# Patient Record
Sex: Male | Born: 1975 | Race: White | Hispanic: Yes | Marital: Married | Smoking: Current every day smoker
Health system: Southern US, Community
[De-identification: ages and names within clinical notes are randomized; demographics above are authoritative.]

## PROBLEM LIST (undated history)

## (undated) DIAGNOSIS — M109 Gout, unspecified: Secondary | ICD-10-CM

---

## 2011-11-10 ENCOUNTER — Encounter (HOSPITAL_COMMUNITY): Payer: Self-pay | Admitting: Emergency Medicine

## 2011-11-10 ENCOUNTER — Emergency Department (HOSPITAL_COMMUNITY)
Admission: EM | Admit: 2011-11-10 | Discharge: 2011-11-10 | Disposition: A | Discharge status: Home / Self Care | Payer: 59 | Attending: Emergency Medicine | Admitting: Emergency Medicine

## 2011-11-10 DIAGNOSIS — J309 Allergic rhinitis, unspecified: Secondary | ICD-10-CM | POA: Insufficient documentation

## 2011-11-10 DIAGNOSIS — J302 Other seasonal allergic rhinitis: Secondary | ICD-10-CM

## 2011-11-10 MED ORDER — LORATADINE 10 MG PO TABS
10.0000 mg | ORAL_TABLET | ORAL | Status: AC
  Administered 2011-11-10: 10 mg via ORAL
  Filled 2011-11-10: qty 1

## 2011-11-10 MED ORDER — LORATADINE 10 MG PO TABS: 10.0000 mg | ORAL_TABLET | Freq: Every day | ORAL | Status: DC

## 2011-11-10 MED ORDER — DEXAMETHASONE SODIUM PHOSPHATE 10 MG/ML IJ SOLN
10.0000 mg | Freq: Once | INTRAMUSCULAR | Status: AC
  Administered 2011-11-10: 10 mg via INTRAMUSCULAR
  Filled 2011-11-10: qty 1

## 2011-11-10 NOTE — ED Notes (Signed)
Patient with scratchy throat since Friday.  Patient states he has been having cough with yellow phelgm.  Patient states that his throat is still itchy and sore.

## 2011-11-10 NOTE — ED Provider Notes (Signed)
History     CSN: [id number]  Arrival date & time 11/10/11  1944   First MD Initiated Contact with Patient 11/10/11 2012      Chief Complaint  Patient presents with  . Sore Throat    (Consider location/radiation/quality/duration/timing/severity/associated sxs/prior treatment) Patient is a 36 y.o. male presenting with pharyngitis. The history is provided by the patient. No language interpreter was used.  Sore Throat This is a new problem. The problem occurs constantly. The problem has been unchanged. Associated symptoms include chills, congestion, coughing and a sore throat. Pertinent negatives include no abdominal pain, anorexia, arthralgias, change in bowel habit, chest pain, diaphoresis, fatigue, fever, headaches, joint swelling, myalgias, nausea, neck pain, numbness, rash, swollen glands, urinary symptoms, vertigo, visual change, vomiting or weakness. The symptoms are aggravated by swallowing. He has tried nothing for the symptoms. The treatment provided no relief.    History reviewed. No pertinent past medical history.  History reviewed. No pertinent past surgical history.  History reviewed. No pertinent family history.  History  Substance Use Topics  . Smoking status: Current Everyday Smoker  . Smokeless tobacco: Not on file  . Alcohol Use: Yes      Review of Systems  Constitutional: Positive for chills. Negative for fever, diaphoresis and fatigue.  HENT: Positive for congestion and sore throat. Negative for neck pain.   Respiratory: Positive for cough.   Cardiovascular: Negative for chest pain.  Gastrointestinal: Negative for nausea, vomiting, abdominal pain, anorexia and change in bowel habit.  Musculoskeletal: Negative for myalgias, joint swelling and arthralgias.  Skin: Negative for rash.  Neurological: Negative for vertigo, weakness, numbness and headaches.  All other systems reviewed and are negative.    Allergies  Review of patient's allergies indicates no  known allergies.  Home Medications  No current outpatient prescriptions on file.  BP 136/90  Pulse 87  Temp(Src) 98.5 F (36.9 C) (Oral)  Resp 16  SpO2 97%  Physical Exam  Nursing note and vitals reviewed. Constitutional: He is oriented to person, place, and time. He appears well-developed and well-nourished. No distress.  HENT:  Head: Normocephalic and atraumatic.  Right Ear: External ear normal.  Left Ear: External ear normal.  Mouth/Throat: No oropharyngeal exudate.       Clear rhinorrhea - mild pharyngeal erythema  Eyes: Conjunctivae are normal. Pupils are equal, round, and reactive to light. No scleral icterus.  Neck: Normal range of motion. Neck supple.  Cardiovascular: Normal rate, regular rhythm and normal heart sounds.  Exam reveals no gallop and no friction rub.   No murmur heard. Pulmonary/Chest: Effort normal and breath sounds normal. No respiratory distress. He has no wheezes. He has no rales. He exhibits no tenderness.  Abdominal: Soft. Bowel sounds are normal. He exhibits no distension. There is no tenderness.  Musculoskeletal: Normal range of motion. He exhibits no edema and no tenderness.  Lymphadenopathy:    He has no cervical adenopathy.  Neurological: He is alert and oriented to person, place, and time. No cranial nerve deficit.  Skin: Skin is warm and dry. No rash noted. No erythema. No pallor.  Psychiatric: He has a normal mood and affect. His behavior is normal. Judgment and thought content normal.    ED Course  Procedures (including critical care time)  Labs Reviewed - No data to display No results found.   Season allergies    MDM  Patient is afebrile with season allergy vs viral pharyngitis type symptoms.  Placed on claritin and given injection of decadron -  will continue the claritin and patient will continue symptomatic relief - understands that this may last for several weeks.        Izola Price [place], [place] 11/10/11 2102

## 2011-11-10 NOTE — Discharge Instructions (Signed)
Allergic Rhinitis Allergic rhinitis is when the mucous membranes in the nose respond to allergens. Allergens are particles in the air that cause your body to have an allergic reaction. This causes you to release allergic antibodies. Through a chain of events, these eventually cause you to release histamine into the blood stream (hence the use of antihistamines). Although meant to be protective to the body, it is this release that causes your discomfort, such as frequent sneezing, congestion and an itchy runny nose.  CAUSES  The pollen allergens may come from grasses, trees, and weeds. This is seasonal allergic rhinitis, or "hay fever." Other allergens cause year-round allergic rhinitis (perennial allergic rhinitis) such as house dust mite allergen, pet dander and mold spores.  SYMPTOMS   Nasal stuffiness (congestion).   Runny, itchy nose with sneezing and tearing of the eyes.   There is often an itching of the mouth, eyes and ears.  It cannot be cured, but it can be controlled with medications. DIAGNOSIS  If you are unable to determine the offending allergen, skin or blood testing may find it. TREATMENT   Avoid the allergen.   Medications and allergy shots (immunotherapy) can help.   Hay fever may often be treated with antihistamines in pill or nasal spray forms. Antihistamines block the effects of histamine. There are over-the-counter medicines that may help with nasal congestion and swelling around the eyes. Check with your caregiver before taking or giving this medicine.  If the treatment above does not work, there are many new medications your caregiver can prescribe. Stronger medications may be used if initial measures are ineffective. Desensitizing injections can be used if medications and avoidance fails. Desensitization is when a patient is given ongoing shots until the body becomes less sensitive to the allergen. Make sure you follow up with your caregiver if problems continue. SEEK  MEDICAL CARE IF:   You develop fever (more than 100.5 F (38.1 C).   You develop a cough that does not stop easily (persistent).   You have shortness of breath.   You start wheezing.   Symptoms interfere with normal daily activities.  Document Released: 03/13/2001 Document Revised: 06/07/2011 Document Reviewed: 09/22/2008 [facility] Patient Information 2012 [place], [place].Allergies, Generic Allergies may happen from anything your body is sensitive to. This may be food, medicines, pollens, chemicals, and nearly anything around you in everyday life that produces allergens. An allergen is anything that causes an allergy producing substance. Heredity is often a factor in causing these problems. This means you may have some of the same allergies as your parents. Food allergies happen in all age groups. Food allergies are some of the most severe and life threatening. Some common food allergies are cow's milk, seafood, eggs, nuts, wheat, and soybeans. SYMPTOMS   Swelling around the mouth.   An itchy red rash or hives.   Vomiting or diarrhea.   Difficulty breathing.  SEVERE ALLERGIC REACTIONS ARE LIFE-THREATENING. This reaction is called anaphylaxis. It can cause the mouth and throat to swell and cause difficulty with breathing and swallowing. In severe reactions only a trace amount of food (for example, peanut oil in a salad) may cause death within seconds. Seasonal allergies occur in all age groups. These are seasonal because they usually occur during the same season every year. They may be a reaction to molds, grass pollens, or tree pollens. Other causes of problems are house dust mite allergens, pet dander, and mold spores. The symptoms often consist of nasal congestion, a runny itchy nose  associated with sneezing, and tearing itchy eyes. There is often an associated itching of the mouth and ears. The problems happen when you come in contact with pollens and other allergens. Allergens are the  particles in the air that the body reacts to with an allergic reaction. This causes you to release allergic antibodies. Through a chain of events, these eventually cause you to release histamine into the blood stream. Although it is meant to be protective to the body, it is this release that causes your discomfort. This is why you were given anti-histamines to feel better. If you are unable to pinpoint the offending allergen, it may be determined by skin or blood testing. Allergies cannot be cured but can be controlled with medicine. Hay fever is a collection of all or some of the seasonal allergy problems. It may often be treated with simple over-the-counter medicine such as diphenhydramine. Take medicine as directed. Do not drink alcohol or drive while taking this medicine. Check with your caregiver or package insert for child dosages. If these medicines are not effective, there are many new medicines your caregiver can prescribe. Stronger medicine such as nasal spray, eye drops, and corticosteroids may be used if the first things you try do not work well. Other treatments such as immunotherapy or desensitizing injections can be used if all else fails. Follow up with your caregiver if problems continue. These seasonal allergies are usually not life threatening. They are generally more of a nuisance that can often be handled using medicine. HOME CARE INSTRUCTIONS   If unsure what causes a reaction, keep a diary of foods eaten and symptoms that follow. Avoid foods that cause reactions.   If hives or rash are present:   Take medicine as directed.   You may use an over-the-counter antihistamine (diphenhydramine) for hives and itching as needed.   Apply cold compresses (cloths) to the skin or take baths in cool water. Avoid hot baths or showers. Heat will make a rash and itching worse.   If you are severely allergic:   Following a treatment for a severe reaction, hospitalization is often required for  closer follow-up.   Wear a medic-alert bracelet or necklace stating the allergy.   You and your family must learn how to give adrenaline or use an anaphylaxis kit.   If you have had a severe reaction, always carry your anaphylaxis kit or EpiPen with you. Use this medicine as directed by your caregiver if a severe reaction is occurring. Failure to do so could have a fatal outcome.  SEEK MEDICAL CARE IF:  You suspect a food allergy. Symptoms generally happen within 30 minutes of eating a food.   Your symptoms have not gone away within 2 days or are getting worse.   You develop new symptoms.   You want to retest yourself or your child with a food or drink you think causes an allergic reaction. Never do this if an anaphylactic reaction to that food or drink has happened before. Only do this under the care of a caregiver.  SEEK IMMEDIATE MEDICAL CARE IF:   You have difficulty breathing, are wheezing, or have a tight feeling in your chest or throat.   You have a swollen mouth, or you have hives, swelling, or itching all over your body.   You have had a severe reaction that has responded to your anaphylaxis kit or an EpiPen. These reactions may return when the medicine has worn off. These reactions should be considered life threatening.  MAKE SURE YOU:   Understand these instructions.   Will watch your condition.   Will get help right away if you are not doing well or get worse.  Document Released: 09/11/2002 Document Revised: 06/07/2011 Document Reviewed: 02/16/2008 [facility] Patient Information 2012 [place], [place].

## 2011-11-10 NOTE — ED Notes (Signed)
Patient with cough and yellow phlegm production.  Patient states he does have a scratchy throat also.  Patient states that it started yesterday.   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

## 2011-11-11 NOTE — ED Provider Notes (Signed)
Medical screening examination/treatment/procedure(s) were performed by non-physician practitioner and as supervising physician I was immediately available for consultation/collaboration.   Carleene Cooper III, MD 11/11/11 1352

## 2011-12-04 ENCOUNTER — Emergency Department (HOSPITAL_COMMUNITY)
Admission: EM | Admit: 2011-12-04 | Discharge: 2011-12-04 | Disposition: A | Discharge status: Home / Self Care | Payer: 59 | Source: Home / Self Care | Attending: Emergency Medicine | Admitting: Emergency Medicine

## 2011-12-04 ENCOUNTER — Encounter (HOSPITAL_COMMUNITY): Payer: Self-pay | Admitting: *Deleted

## 2011-12-04 DIAGNOSIS — R05 Cough: Secondary | ICD-10-CM

## 2011-12-04 DIAGNOSIS — J029 Acute pharyngitis, unspecified: Secondary | ICD-10-CM

## 2011-12-04 LAB — POCT RAPID STREP A: Streptococcus, Group A Screen (Direct): NEGATIVE

## 2011-12-04 MED ORDER — GUAIFENESIN-CODEINE 100-10 MG/5ML PO SYRP: 5.0000 mL | ORAL_SOLUTION | Freq: Three times a day (TID) | ORAL | Status: AC | PRN

## 2011-12-04 MED ORDER — FEXOFENADINE-PSEUDOEPHED ER 60-120 MG PO TB12: 1.0000 | ORAL_TABLET | Freq: Two times a day (BID) | ORAL | Status: AC

## 2011-12-04 NOTE — ED Provider Notes (Signed)
History     CSN: [id number]  Arrival date & time 12/04/11  1705   First MD Initiated Contact with Patient 12/04/11 1708      Chief Complaint  Patient presents with  . Sore Throat    (Consider location/radiation/quality/duration/timing/severity/associated sxs/prior treatment) HPI Comments: Patient presents today to urgent care describing that despite some mild improvement after the steroid shot that he got to the emergency department his symptoms are back as he is having pain and discomfort on his throat and is also coughing as he slammed on his throat. He denies any shortness of breath or wheezing. Does describe some phlegm and cough and occasional sneezing although not frequently. Is taking over-the-counter Claritin but have not tried any decongestants.  Patient is a 36 y.o. male presenting with pharyngitis. The history is provided by the patient.  Sore Throat This is a recurrent problem. The current episode started more than 1 week ago. The problem occurs constantly. The problem has not changed since onset.Pertinent negatives include no abdominal pain and no shortness of breath. The symptoms are aggravated by swallowing. The symptoms are relieved by nothing. Treatments tried: loratadine.    History reviewed. No pertinent past medical history.  History reviewed. No pertinent past surgical history.  No family history on file.  History  Substance Use Topics  . Smoking status: Current Everyday Smoker  . Smokeless tobacco: Not on file  . Alcohol Use: Yes      Review of Systems  Constitutional: Negative for fever, chills, diaphoresis, activity change, appetite change, fatigue and unexpected weight change.  HENT: Positive for sore throat. Negative for congestion, facial swelling, rhinorrhea, trouble swallowing, neck pain, neck stiffness and voice change.   Respiratory: Positive for cough. Negative for shortness of breath and wheezing.   Gastrointestinal: Negative for abdominal  pain.    Allergies  Review of patient's allergies indicates no known allergies.  Home Medications   Current Outpatient Rx  Name Route Sig Dispense Refill  . FEXOFENADINE-PSEUDOEPHED ER 60-120 MG PO TB12 Oral Take 1 tablet by mouth every 12 (twelve) hours. 30 tablet 0  . GUAIFENESIN-CODEINE 100-10 MG/5ML PO SYRP Oral Take 5 mLs by mouth 3 (three) times daily as needed for cough. 120 mL 0    BP 130/84  Pulse 86  Temp(Src) 98.5 F (36.9 C) (Oral)  Resp 16  SpO2 99%  Physical Exam  Nursing note and vitals reviewed. Constitutional: He appears well-developed and well-nourished. No distress.  HENT:  Head: Normocephalic.  Mouth/Throat: Uvula is midline and mucous membranes are normal. Posterior oropharyngeal erythema present. No oropharyngeal exudate, posterior oropharyngeal edema or tonsillar abscesses.  Eyes: Conjunctivae are normal.  Cardiovascular: Normal rate.   Pulmonary/Chest: Effort normal and breath sounds normal. No respiratory distress. He has no wheezes. He has no rales. He exhibits no tenderness.  Abdominal: He exhibits no distension.  Skin: No rash noted. No erythema.    ED Course  Procedures (including critical care time)   Labs Reviewed  POCT RAPID STREP A ([facility]RG CARE ONLY)   No results found.   1. Pharyngitis   2. Cough       MDM  Recurrent pharyngitis. Patient went to the emergency department recently and got a steroid shot. As he was diagnosis allergenic symptoms. He said he improved after shot the symptoms have returned again. Strep test was negative patient has no stigmata of an infectious process but, no fevers no malaise or generalized symptoms. Encourage patient to take a cycle of Allegra-D for  2 weeks and have prescribed a suspicion for his cough and postnasal drip. Encourage him to followup with ENT Dr. if no improvement is noted.        Jimmie Molly, MD 12/04/11 [phone]

## 2011-12-04 NOTE — Discharge Instructions (Signed)
     As discussed if no improvement after 2 weeks using both Allegra-D in the cough suppressant should followup with the ENT Dr. Your symptoms and exam were not consistent with an infectious process. You had a negative strep test today as well.   Cough, Adult  A cough is a reflex that helps clear your throat and airways. It can help heal the body or may be a reaction to an irritated airway. A cough may only last 2 or 3 weeks (acute) or may last more than 8 weeks (chronic).  CAUSES Acute cough:  Viral or bacterial infections.  Chronic cough:  Infections.   Allergies.   Asthma.   Post-nasal drip.   Smoking.   Heartburn or acid reflux.   Some medicines.   Chronic lung problems (COPD).   Cancer.  SYMPTOMS   Cough.   Fever.   Chest pain.   Increased breathing rate.   High-pitched whistling sound when breathing (wheezing).   Colored mucus that you cough up (sputum).  TREATMENT   A bacterial cough may be treated with antibiotic medicine.   A viral cough must run its course and will not respond to antibiotics.   Your caregiver may recommend other treatments if you have a chronic cough.  HOME CARE INSTRUCTIONS   Only take over-the-counter or prescription medicines for pain, discomfort, or fever as directed by your caregiver. Use cough suppressants only as directed by your caregiver.   Use a cold steam vaporizer or humidifier in your bedroom or home to help loosen secretions.   Sleep in a semi-upright position if your cough is worse at night.   Rest as needed.   Stop smoking if you smoke.  SEEK IMMEDIATE MEDICAL CARE IF:   You have pus in your sputum.   Your cough starts to worsen.   You cannot control your cough with suppressants and are losing sleep.   You begin coughing up blood.   You have difficulty breathing.   You develop pain which is getting worse or is uncontrolled with medicine.   You have a fever.  MAKE SURE YOU:   Understand these  instructions.   Will watch your condition.   Will get help right away if you are not doing well or get worse.  Document Released: 12/15/2010 Document Revised: 06/07/2011 Document Reviewed: 12/15/2010 [facility] Patient Information 2012 [place], [place].

## 2011-12-04 NOTE — ED Notes (Signed)
Pt    Reports  Symptoms  Of  sorethroat        Cough          Swollen  Sensation in throat          Seen er  Last  Week  Given steriod  Shot   He         Reports  He  Was  Told  He  Had  allergys  And  Has  Been taking       otc    meds         He  Reports  Symptoms  Worse  Since last week

## 2012-02-15 ENCOUNTER — Emergency Department (HOSPITAL_COMMUNITY)
Admission: EM | Admit: 2012-02-15 | Discharge: 2012-02-15 | Disposition: A | Discharge status: Home / Self Care | Payer: 59 | Source: Home / Self Care

## 2012-02-15 ENCOUNTER — Encounter (HOSPITAL_COMMUNITY): Payer: Self-pay | Admitting: *Deleted

## 2012-02-15 DIAGNOSIS — M79673 Pain in unspecified foot: Secondary | ICD-10-CM

## 2012-02-15 DIAGNOSIS — M109 Gout, unspecified: Secondary | ICD-10-CM

## 2012-02-15 DIAGNOSIS — M79609 Pain in unspecified limb: Secondary | ICD-10-CM

## 2012-02-15 HISTORY — DX: Gout, unspecified: M10.9

## 2012-02-15 LAB — POCT I-STAT, CHEM 8
BUN: 17 mg/dL (ref 6–23)
Chloride: 102 mEq/L (ref 96–112)
HCT: 55 % — ABNORMAL HIGH (ref 39.0–52.0)
Potassium: 4.2 mEq/L (ref 3.5–5.1)
Sodium: 139 mEq/L (ref 135–145)

## 2012-02-15 MED ORDER — INDOMETHACIN 25 MG PO CAPS: 25.0000 mg | ORAL_CAPSULE | Freq: Three times a day (TID) | ORAL | Status: AC | PRN

## 2012-02-15 NOTE — ED Provider Notes (Signed)
Medical screening examination/treatment/procedure(s) were performed by non-physician practitioner and as supervising physician I was immediately available for consultation/collaboration.  Asher Babilonia   Luciano Cinquemani, MD 02/15/12 1714 

## 2012-02-15 NOTE — ED Notes (Signed)
Pt  Has  Gout    He  Took  Indocin in  Past   - he  Is  Out of  meds  He   Has  Pain r  Foot  Big toe  Area   Today  He  Voices  No  Other  Complaints

## 2012-02-15 NOTE — ED Provider Notes (Signed)
History     CSN: [id number]  Arrival date & time 02/15/12  1316   None     Chief Complaint  Patient presents with  . Gout    (Consider location/radiation/quality/duration/timing/severity/associated sxs/prior treatment) The history is provided by the patient.   James Scott is a 36 y.o. male who complains of right great toe pain since this morning.  Mechanism of injury: none. Immediate symptoms: pain and tenderness. Symptoms have been increasingly worse since that time, worse with movement of toe and ambulating.  Prior history of related problems: known history of gout for which he has taken indomethacin, noticed medication expired this am. Past Medical History  Diagnosis Date  . Gout     History reviewed. No pertinent past surgical history.  No family history on file.  History  Substance Use Topics  . Smoking status: Current Everyday Smoker  . Smokeless tobacco: Not on file  . Alcohol Use: Yes      Review of Systems  Constitutional: Negative.   Respiratory: Negative.   Cardiovascular: Negative.   Musculoskeletal: Positive for joint swelling. Negative for myalgias, back pain, arthralgias and gait problem.  Skin: Negative.     Allergies  Review of patient's allergies indicates no known allergies.  Home Medications   Current Outpatient Rx  Name Route Sig Dispense Refill  . FEXOFENADINE-PSEUDOEPHED ER 60-120 MG PO TB12 Oral Take 1 tablet by mouth every 12 (twelve) hours. 30 tablet 0  . INDOMETHACIN 25 MG PO CAPS Oral Take 1 capsule (25 mg total) by mouth 3 (three) times daily as needed. 30 capsule 2    BP 136/99  Pulse 80  Temp 98.1 F (36.7 C) (Oral)  Resp 18  SpO2 100%  Physical Exam  Nursing note and vitals reviewed. Constitutional: He is oriented to person, place, and time. Vital signs are normal. He appears well-developed and well-nourished. He is active and cooperative.  HENT:  Head: Normocephalic.  Eyes: Conjunctivae are normal. Pupils are equal,  round, and reactive to light. No scleral icterus.  Neck: Trachea normal. Neck supple.  Cardiovascular: Normal rate and regular rhythm.   Pulmonary/Chest: Effort normal and breath sounds normal.  Musculoskeletal:       Right knee: Normal.       Right ankle: Normal. Achilles tendon normal.       Left ankle: Normal. Achilles tendon normal.       Right foot: He exhibits tenderness. He exhibits normal range of motion, no bony tenderness, no swelling, normal capillary refill, no crepitus, no deformity and no laceration.       Left foot: Normal.       Feet:       Right great toe tenderness, swelling, no drainage, no cellulitis, skin intact  Neurological: He is alert and oriented to person, place, and time. No cranial nerve deficit or sensory deficit.  Skin: Skin is warm and dry.  Psychiatric: He has a normal mood and affect. His speech is normal and behavior is normal. Judgment and thought content normal. Cognition and memory are normal.    ED Course  Procedures (including critical care time)  Labs Reviewed  POCT I-STAT, CHEM 8 - Abnormal; Notable for the following:    Glucose, Bld 105 (*)     Hemoglobin 18.7 (*)     HCT 55.0 (*)     All other components within normal limits   No results found.   1. Gout attack   2. Foot pain  MDM  Call health connect so that you may establish a primary care provider.  Take indomethacin as prescribed.  Return if symptoms are not improved.          Johnsie Kindred, NP 02/15/12 1423

## 2014-08-24 ENCOUNTER — Emergency Department (INDEPENDENT_AMBULATORY_CARE_PROVIDER_SITE_OTHER): Payer: BLUE CROSS/BLUE SHIELD

## 2014-08-24 ENCOUNTER — Encounter (HOSPITAL_COMMUNITY): Payer: Self-pay | Admitting: Emergency Medicine

## 2014-08-24 ENCOUNTER — Emergency Department (HOSPITAL_COMMUNITY)
Admission: EM | Admit: 2014-08-24 | Discharge: 2014-08-24 | Disposition: A | Discharge status: Home / Self Care | Payer: BLUE CROSS/BLUE SHIELD | Source: Home / Self Care | Attending: Family Medicine | Admitting: Family Medicine

## 2014-08-24 DIAGNOSIS — J209 Acute bronchitis, unspecified: Secondary | ICD-10-CM

## 2014-08-24 MED ORDER — HYDROCOD POLST-CHLORPHEN POLST 10-8 MG/5ML PO LQCR: 5.0000 mL | Freq: Two times a day (BID) | ORAL | Status: DC | PRN

## 2014-08-24 MED ORDER — BENZONATATE 200 MG PO CAPS: 200.0000 mg | ORAL_CAPSULE | Freq: Three times a day (TID) | ORAL | Status: DC | PRN

## 2014-08-24 MED ORDER — PREDNISONE 10 MG PO TABS: ORAL_TABLET | ORAL | Status: DC

## 2014-08-24 MED ORDER — ALBUTEROL SULFATE HFA 108 (90 BASE) MCG/ACT IN AERS: 2.0000 | INHALATION_SPRAY | RESPIRATORY_TRACT | Status: DC | PRN

## 2014-08-24 NOTE — ED Notes (Signed)
C/o  Persistent cough for 5 days.  States worse at night and is productive.  No relief with robitussin and mucinex.   Fever off/on with chills.

## 2014-08-24 NOTE — Discharge Instructions (Signed)

## 2014-08-24 NOTE — ED Provider Notes (Signed)
CSN: [id number]     Arrival date & time 08/24/14  4098 History   None    Chief Complaint  Patient presents with  . Cough   (Consider location/radiation/quality/duration/timing/severity/associated sxs/prior Treatment) HPI      39 year old male presents for cough. He has had a cough for 5 days. It is constant, worse at night. His wife recently had a similar illness. He also has chills, no measured fever. He has mild shortness of breath on exertion. No chest pain, NVD. No recent travel  Past Medical History  Diagnosis Date  . Gout    History reviewed. No pertinent past surgical history. History reviewed. No pertinent family history. History  Substance Use Topics  . Smoking status: Current Every Day Smoker  . Smokeless tobacco: Not on file  . Alcohol Use: Yes    Review of Systems  Constitutional: Positive for chills. Negative for fever.  HENT: Positive for congestion and sore throat. Negative for ear pain and rhinorrhea.   Respiratory: Positive for cough, chest tightness and shortness of breath. Negative for wheezing.   Cardiovascular: Negative for chest pain.  Gastrointestinal: Negative for nausea, vomiting, abdominal pain and diarrhea.  All other systems reviewed and are negative.   Allergies  Review of patient's allergies indicates no known allergies.  Home Medications   Prior to Admission medications   Medication Sig Start Date End Date Taking? Authorizing Provider  albuterol (PROVENTIL HFA;VENTOLIN HFA) 108 (90 BASE) MCG/ACT inhaler Inhale 2 puffs into the lungs every 4 (four) hours as needed for wheezing. 08/24/14   Graylon Good, PA-C  benzonatate (TESSALON) 200 MG capsule Take 1 capsule (200 mg total) by mouth 3 (three) times daily as needed for cough. 08/24/14   Graylon Good, PA-C  chlorpheniramine-HYDROcodone (TUSSIONEX PENNKINETIC ER) 10-8 MG/5ML LQCR Take 5 mLs by mouth every 12 (twelve) hours as needed for cough. 08/24/14   Graylon Good, PA-C  predniSONE  (DELTASONE) 10 MG tablet 4 tabs PO QD for 4 days; 3 tabs PO QD for 3 days; 2 tabs PO QD for 2 days; 1 tab PO QD for 1 day 08/24/14   Graylon Good, PA-C   BP 133/95 mmHg  Pulse 81  Temp(Src) 98.2 F (36.8 C) (Oral)  Resp 16  SpO2 99% Physical Exam  Constitutional: He is oriented to person, place, and time. He appears well-developed and well-nourished. No distress.  HENT:  Head: Normocephalic and atraumatic.  Right Ear: External ear normal.  Left Ear: External ear normal.  Nose: Nose normal.  Mouth/Throat: Oropharynx is clear and moist. No oropharyngeal exudate.  Neck: Normal range of motion. Neck supple.  Cardiovascular: Normal rate, regular rhythm and normal heart sounds.   Pulmonary/Chest: Effort normal. No accessory muscle usage. No respiratory distress. He has wheezes in the left middle field. He has no rhonchi. He has no rales.  Lymphadenopathy:    He has no cervical adenopathy.  Neurological: He is alert and oriented to person, place, and time. Coordination normal.  Skin: Skin is warm and dry. No rash noted. He is not diaphoretic.  Psychiatric: He has a normal mood and affect. Judgment normal.  Nursing note and vitals reviewed.   ED Course  Procedures (including critical care time) Labs Review Labs Reviewed - No data to display  Imaging Review No results found.   MDM   1. Acute bronchitis, unspecified organism    Chest x-ray is normal. Treat symptomatically for viral bronchitis. Follow-up when necessary   Meds ordered this encounter  Medications  . predniSONE (DELTASONE) 10 MG tablet    Sig: 4 tabs PO QD for 4 days; 3 tabs PO QD for 3 days; 2 tabs PO QD for 2 days; 1 tab PO QD for 1 day    Dispense:  30 tablet    Refill:  0    Order Specific Question:  Supervising Provider    Answer:  Clementeen GrahamOREY, EVAN, S [[id number]]  . albuterol (PROVENTIL HFA;VENTOLIN HFA) 108 (90 BASE) MCG/ACT inhaler    Sig: Inhale 2 puffs into the lungs every 4 (four) hours as needed for wheezing.     Dispense:  1 Inhaler    Refill:  0    Order Specific Question:  Supervising Provider    Answer:  Clementeen GrahamOREY, EVAN, S [id number]]  . chlorpheniramine-HYDROcodone (TUSSIONEX PENNKINETIC ER) 10-8 MG/5ML LQCR    Sig: Take 5 mLs by mouth every 12 (twelve) hours as needed for cough.    Dispense:  115 mL    Refill:  0    Order Specific Question:  Supervising Provider    Answer:  Clementeen GrahamOREY, EVAN, S [id number]]  . benzonatate (TESSALON) 200 MG capsule    Sig: Take 1 capsule (200 mg total) by mouth 3 (three) times daily as needed for cough.    Dispense:  20 capsule    Refill:  0    Order Specific Question:  Supervising Provider    Answer:  Clementeen GrahamOREY, EVAN, S [[id number]]       Graylon GoodZachary H Randle Shatzer, PA-C 08/24/14 [address]1142  Aalyssa Elderkin H ElkportBaker, PA-C 08/31/14 [phone]

## 2016-06-29 IMAGING — DX DG CHEST 2V
2 series · 2 of 2 positions shown · non-contrast
Comparison: 04/15/2012

CLINICAL DATA: Persistent cough for 5 days

EXAM:
CHEST  2 VIEW

[chest pa]
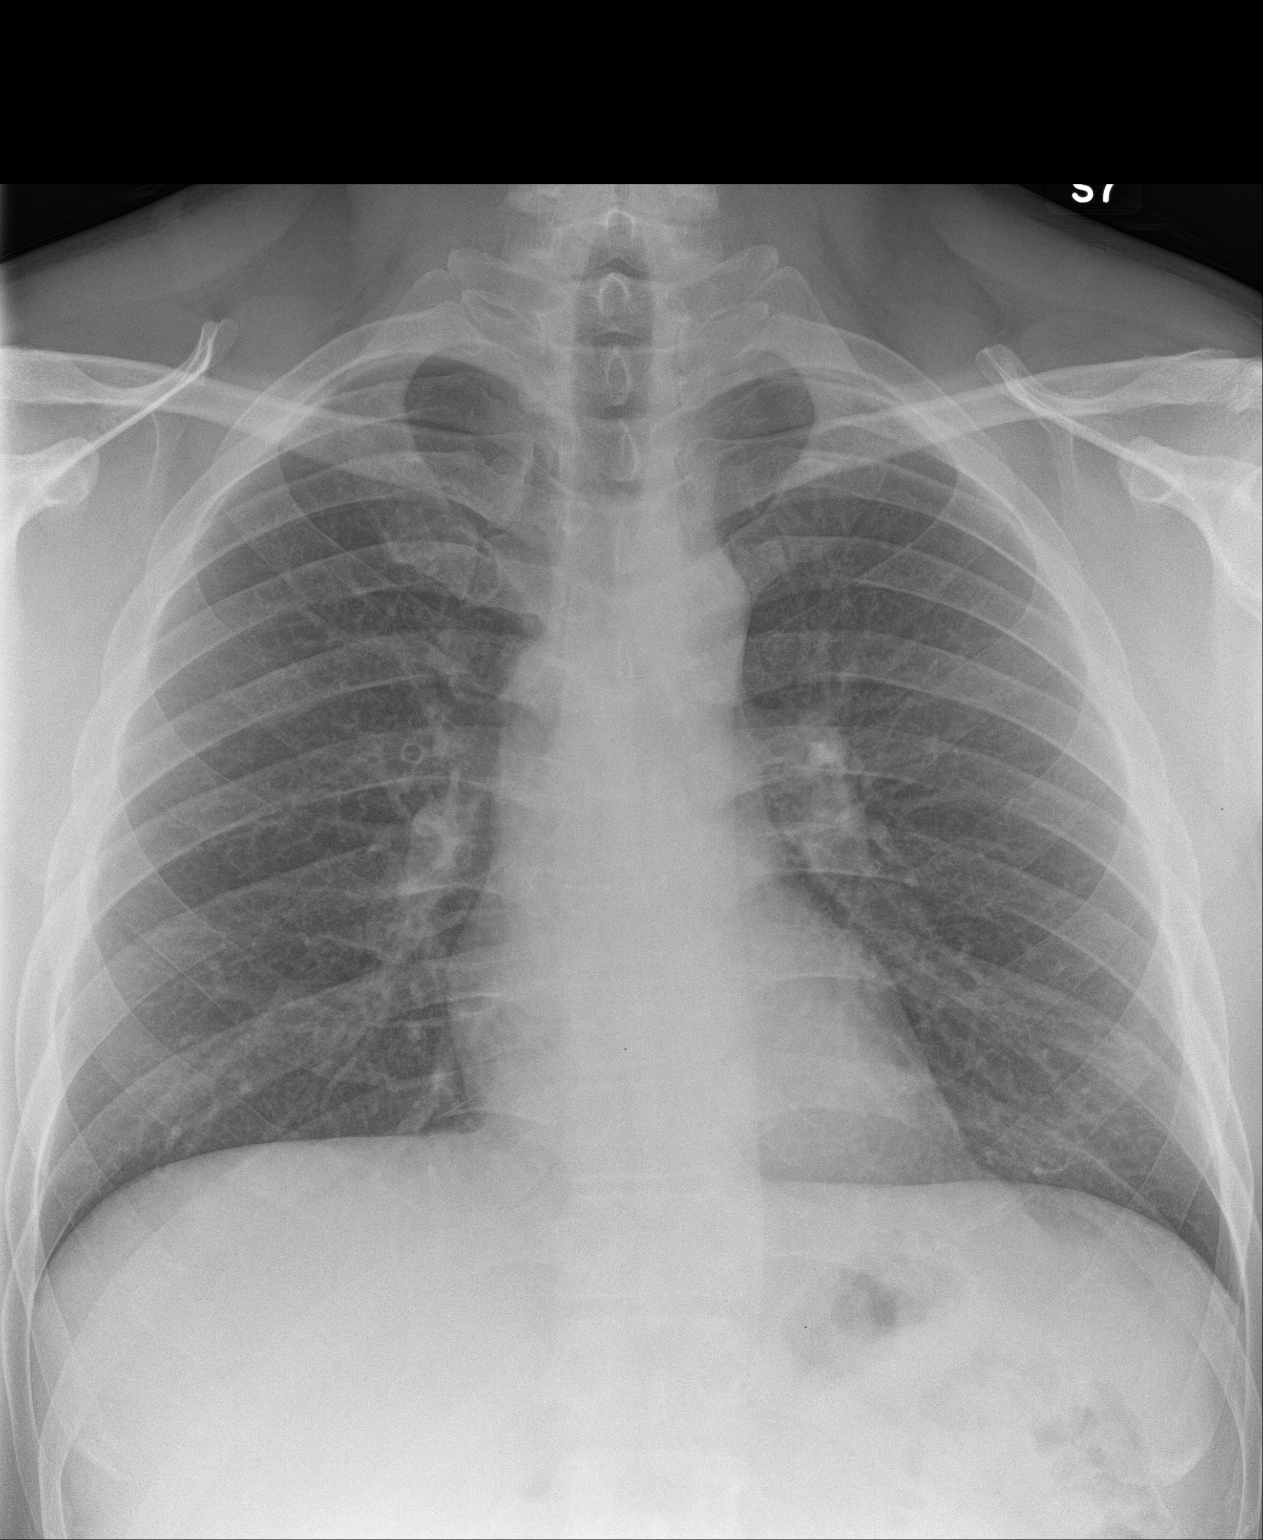

[chest lat]
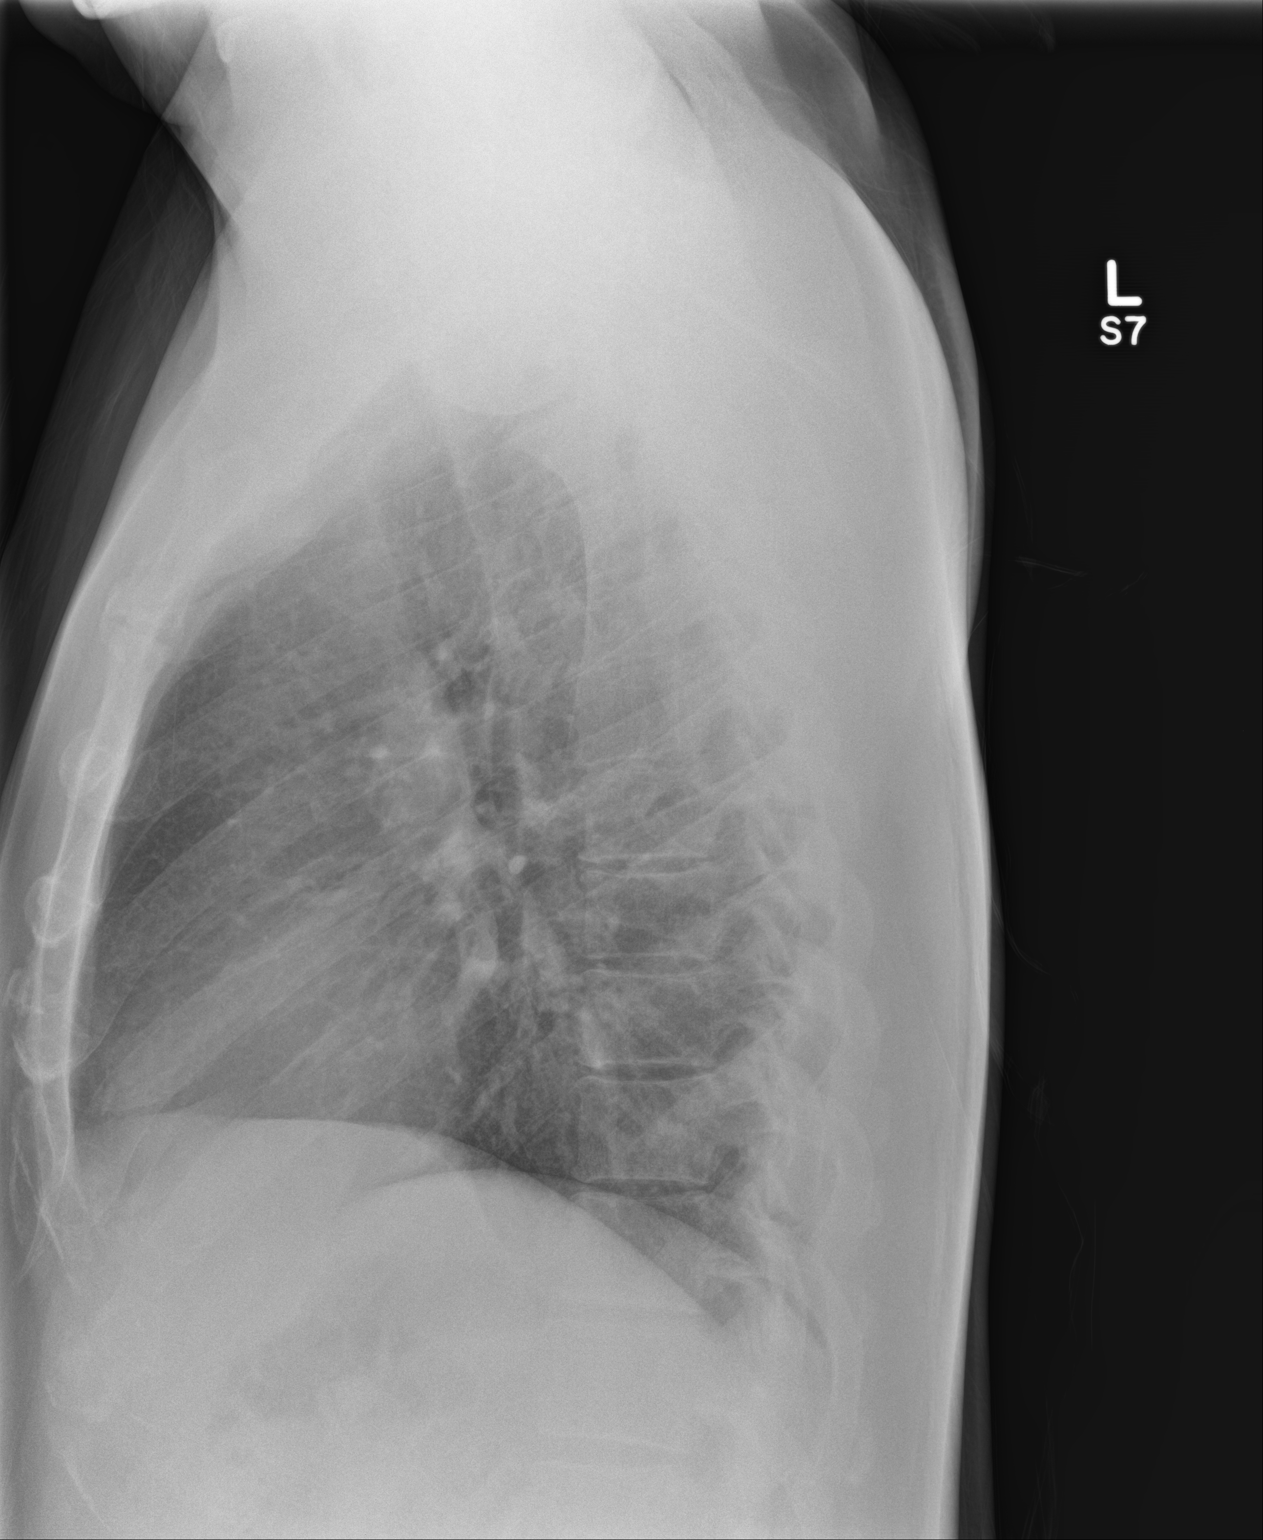

[2 of 2 positions shown; findings below may reference images not displayed]

FINDINGS: Cardiomediastinal silhouette is unremarkable. No acute infiltrate or
pleural effusion. No pulmonary edema. Minimal hyperinflation.
IMPRESSION: No active disease.  Minimal hyperinflation.

## 2017-08-28 DIAGNOSIS — J069 Acute upper respiratory infection, unspecified: Secondary | ICD-10-CM | POA: Diagnosis not present

## 2018-12-18 ENCOUNTER — Ambulatory Visit (HOSPITAL_COMMUNITY)
Admission: EM | Admit: 2018-12-18 | Discharge: 2018-12-18 | Disposition: A | Discharge status: Home / Self Care | Payer: BC Managed Care – PPO | Attending: Emergency Medicine | Admitting: Emergency Medicine

## 2018-12-18 ENCOUNTER — Other Ambulatory Visit: Payer: Self-pay

## 2018-12-18 ENCOUNTER — Encounter (HOSPITAL_COMMUNITY): Payer: Self-pay

## 2018-12-18 DIAGNOSIS — M109 Gout, unspecified: Secondary | ICD-10-CM

## 2018-12-18 MED ORDER — COLCHICINE 0.6 MG PO TABS: ORAL_TABLET | ORAL | 0 refills | Status: AC

## 2018-12-18 MED ORDER — PREDNISONE 20 MG PO TABS: 40.0000 mg | ORAL_TABLET | Freq: Every day | ORAL | 0 refills | Status: AC

## 2018-12-18 NOTE — Discharge Instructions (Signed)
Take the indomethacin twice a day for the next 5 to 7 days.  Take 1 g of Tylenol with it.  Finish the prednisone and take colchicine as directed.  You may benefit from being put on allopurinol, which is a preventative medication.  Try to cut back on the alcohol intake and your intake of shellfish and red meat.  Dietary treatment plays a supplementary role in treatment of gout.  Diet can be expected to decrease the uric acid level by 10 to 15%.  Often medication can effect a much more substantial reduction.  An extremely restrictive diet is not necessary, but here are a few suggestions that might help decrease the frequency of gout attacks.  The first and most important measure is to achieve and maintain a healthy body weight (BMI of 20 to 25).  It's been shown that people with a BMI over 25 have an increased risk of gout attacks compared to people with a BMI of less than 25.  Also, gout patients who lose as little as 4.5 kg or 9.9 lbs will decrease their risk of gout attacks.  Beyond that, here are a few general guidelines:  Eat less: Red meat Seafood Beer and hard alcohol (e.g. gin, vodka, whiskey) Foods that contain high fructose corn syrup (found in sweets and non-diet sodas) Organ meats (liver, kidneys, brains, sweetbreads) or foods made from these meats (hot dogs, bologna).  Eat more: Low fat dairy products A moderate amount of wine (up to two 5 oz servings per day) is not likely to increase the risk of a gout attack. Coffee may decrease the risk of gout attacks Vitamin C (500 mg per day) has a mild urate lowering effect  Go to www.goodrx.com to look up your medications. This will give you a list of where you can find your prescriptions at the most affordable prices. Or ask the pharmacist what the cash price is, or if they have any other discount programs available to help make your medication more affordable. This can be less expensive than what you would pay with insurance.

## 2018-12-18 NOTE — ED Triage Notes (Signed)
Pt presents with gout in left foot.

## 2018-12-18 NOTE — ED Provider Notes (Signed)
HPI  SUBJECTIVE:   James Scott is a 43 y.o. male who presents with 3 days of left knee pain, swelling.  Describes the pain as sore, throbbing, constant.  He states that his knee is hypersensitive to the touch.  States that he has significant pain with bending his knee.  He states this is identical to previous gout attacks.  He denies fevers, body aches, trauma, redness, change in his physical activity.  No distal numbness or tingling.  He tried indomethacin 50 mg daily with some improvement in his symptoms.  He is supposed to take it twice a day.  Symptoms are worse with bending his knee.  He drinks 10-12 beers a day, and has been eating red meat and shellfish.  He has a past medical history of gout and his right first toe, left knee.  He continues to smoke.  No history of diabetes, septic joint, hypertension, kidney disease.  PMD: None.  Patient has a history of gout.  was seen here in 2013 for gout in his right great toe and in 2015 by his PMD for a flare in his left knee.  Past Medical History:  Diagnosis Date  . Gout     History reviewed. No pertinent surgical history.  Family History  Family history unknown: Yes    Social History   Tobacco Use  . Smoking status: Current Every Day Smoker  Substance Use Topics  . Alcohol use: Yes  . Drug use: Not on file    No current facility-administered medications for this encounter.   Current Outpatient Medications:  .  colchicine 0.6 MG tablet, 2 tabs po x 1, then one tab po 1 hour later, Disp: 6 tablet, Rfl: 0 .  predniSONE (DELTASONE) 20 MG tablet, Take 2 tablets (40 mg total) by mouth daily with breakfast for 5 days., Disp: 10 tablet, Rfl: 0  No Known Allergies   ROS  As noted in HPI.   Physical Exam  BP (!) 148/106 (BP Location: Left Arm)   Pulse 92   Temp 98.4 F (36.9 C) (Oral)   Resp 18   SpO2 100%   Constitutional: Well developed, well nourished, no acute distress Eyes:  EOMI, conjunctiva normal bilaterally HENT:  Normocephalic, atraumatic,mucus membranes moist Respiratory: Normal inspiratory effort Cardiovascular: Normal rate GI: nondistended skin: No rash, skin intact Musculoskeletal: Mild swelling left knee.  Positive tenderness along the medial and lateral patella retinaculum.  No tenderness of the medial, lateral joint.  No popliteal tenderness.  No patellar tendon tenderness.  Mild tenderness over the patella.  No erythema, increased temperature.  Trace effusion.  Pain with flexion.  Patient holding it in extension, but I am able to flex it to 90 degrees.  Joint stable on varus/valgus stress.  Distal sensation intact. Neurologic: Alert & oriented x 3, no focal neuro deficits Psychiatric: Speech and behavior appropriate   ED Course   Medications - No data to display  No orders of the defined types were placed in this encounter.   No results found for this or any previous visit (from the past 24 hour(s)). No results found.  ED Clinical Impression  Acute gout of left knee, unspecified cause    ED Assessment/Plan  Previous records reviewed.  As noted in HPI  Patient states is identical to previous gout flares and has responded well to prednisone and colchicine in the past.  Will send home with prescription of both.  increase indomethacin to twice a day, may add 1 g of  Tylenol to this.  He has 3 more refills of his indomethacin available.  We will have him take it for 5 to 7 days.  Doubt septic joint at this time.  Will provide a primary care list for ongoing care, also advised patient to curtail drinking, red meat/shellfish intake.  Discussed MDM, treatment plan, and plan for follow-up with patient. Discussed sn/sx that should prompt return to the ED. patient agrees with plan.   Meds ordered this encounter  Medications  . colchicine 0.6 MG tablet    Sig: 2 tabs po x 1, then one tab po 1 hour later    Dispense:  6 tablet    Refill:  0  . predniSONE (DELTASONE) 20 MG tablet    Sig:  Take 2 tablets (40 mg total) by mouth daily with breakfast for 5 days.    Dispense:  10 tablet    Refill:  0    *This clinic note was created using Scientist, clinical (histocompatibility and immunogenetics)Dragon dictation software. Therefore, there may be occasional mistakes despite careful proofreading.   ?    Domenick GongMortenson, Zniya Cottone, MD 12/18/18 1404

## 2019-02-10 DIAGNOSIS — M109 Gout, unspecified: Secondary | ICD-10-CM | POA: Diagnosis not present

## 2019-05-11 DIAGNOSIS — M1A0620 Idiopathic chronic gout, left knee, without tophus (tophi): Secondary | ICD-10-CM | POA: Diagnosis not present

## 2020-01-15 DIAGNOSIS — R634 Abnormal weight loss: Secondary | ICD-10-CM | POA: Diagnosis not present

## 2020-01-15 DIAGNOSIS — B353 Tinea pedis: Secondary | ICD-10-CM | POA: Diagnosis not present

## 2023-09-10 ENCOUNTER — Other Ambulatory Visit: Payer: Self-pay

## 2023-09-10 ENCOUNTER — Encounter (HOSPITAL_BASED_OUTPATIENT_CLINIC_OR_DEPARTMENT_OTHER): Payer: Self-pay | Admitting: Emergency Medicine

## 2023-09-10 ENCOUNTER — Emergency Department (HOSPITAL_BASED_OUTPATIENT_CLINIC_OR_DEPARTMENT_OTHER): Admitting: Radiology

## 2023-09-10 ENCOUNTER — Emergency Department (HOSPITAL_BASED_OUTPATIENT_CLINIC_OR_DEPARTMENT_OTHER)
Admission: EM | Admit: 2023-09-10 | Discharge: 2023-09-10 | Disposition: A | Discharge status: Home / Self Care | Attending: Emergency Medicine | Admitting: Emergency Medicine

## 2023-09-10 DIAGNOSIS — R0602 Shortness of breath: Secondary | ICD-10-CM | POA: Diagnosis present

## 2023-09-10 DIAGNOSIS — F17210 Nicotine dependence, cigarettes, uncomplicated: Secondary | ICD-10-CM | POA: Insufficient documentation

## 2023-09-10 LAB — CBC WITH DIFFERENTIAL/PLATELET
Abs Immature Granulocytes: 0.01 10*3/uL (ref 0.00–0.07)
Basophils Absolute: 0.1 10*3/uL (ref 0.0–0.1)
Basophils Relative: 1 %
Eosinophils Absolute: 0.1 10*3/uL (ref 0.0–0.5)
Eosinophils Relative: 1 %
HCT: 45.7 % (ref 39.0–52.0)
Hemoglobin: 16.5 g/dL (ref 13.0–17.0)
Immature Granulocytes: 0 %
Lymphocytes Relative: 33 %
Lymphs Abs: 2.4 10*3/uL (ref 0.7–4.0)
MCH: 34.9 pg — ABNORMAL HIGH (ref 26.0–34.0)
MCHC: 36.1 g/dL — ABNORMAL HIGH (ref 30.0–36.0)
MCV: 96.6 fL (ref 80.0–100.0)
Monocytes Absolute: 0.9 10*3/uL (ref 0.1–1.0)
Monocytes Relative: 12 %
Neutro Abs: 3.9 10*3/uL (ref 1.7–7.7)
Neutrophils Relative %: 53 %
Platelets: 255 10*3/uL (ref 150–400)
RBC: 4.73 MIL/uL (ref 4.22–5.81)
RDW: 12.1 % (ref 11.5–15.5)
WBC: 7.4 10*3/uL (ref 4.0–10.5)
nRBC: 0 % (ref 0.0–0.2)

## 2023-09-10 LAB — BASIC METABOLIC PANEL
Anion gap: 6 (ref 5–15)
BUN: 14 mg/dL (ref 6–20)
CO2: 30 mmol/L (ref 22–32)
Calcium: 9.7 mg/dL (ref 8.9–10.3)
Chloride: 102 mmol/L (ref 98–111)
Creatinine, Ser: 1.18 mg/dL (ref 0.61–1.24)
GFR, Estimated: >60 mL/min (ref >60)
Glucose, Bld: 105 mg/dL — ABNORMAL HIGH (ref 70–99)
Potassium: 4.3 mmol/L (ref 3.5–5.1)
Sodium: 138 mmol/L (ref 135–145)

## 2023-09-10 LAB — D-DIMER, QUANTITATIVE: D-Dimer, Quant: 0.28 ug{FEU}/mL (ref 0.00–0.50)

## 2023-09-10 MED ORDER — PANTOPRAZOLE SODIUM 40 MG IV SOLR
40.0000 mg | Freq: Once | INTRAVENOUS | Status: AC
  Administered 2023-09-10: 40 mg via INTRAVENOUS
  Filled 2023-09-10: qty 10

## 2023-09-10 MED ORDER — ALUM & MAG HYDROXIDE-SIMETH 200-200-20 MG/5ML PO SUSP
30.0000 mL | Freq: Once | ORAL | Status: AC
  Administered 2023-09-10: 30 mL via ORAL
  Filled 2023-09-10: qty 30

## 2023-09-10 MED ORDER — PANTOPRAZOLE SODIUM 20 MG PO TBEC: 20.0000 mg | DELAYED_RELEASE_TABLET | Freq: Every day | ORAL | 0 refills | Status: AC

## 2023-09-10 MED ORDER — FAMOTIDINE IN NACL 20-0.9 MG/50ML-% IV SOLN
20.0000 mg | Freq: Once | INTRAVENOUS | Status: AC
Start: 2023-09-10 — End: 2023-09-10
  Administered 2023-09-10: 20 mg via INTRAVENOUS
  Filled 2023-09-10: qty 50

## 2023-09-10 NOTE — Discharge Instructions (Addendum)
 You have been seen today for your complaint of shortness of breath, throat pain.  I think your shortness of breath is due to esophagitis from your GERD.  This is likely secondary to your alcohol use. Your lab work was reassuring. Your imaging was reassuring. Your discharge medications include Protonix.  This is a medicine used to help with your symptoms.  Use this once daily for at least 1 month. Home care instructions are as follows:  Slowly decrease alcohol use Follow up with: Your primary care provider in 1 week Please seek immediate medical care if you develop any of the following symptoms: You have very bad pain in your arms, neck, jaw, teeth, or back. You feel sweaty, dizzy, or light-headed all of a sudden. You have chest pain or feel short of breath. You throw up and: It's green, yellow, or black. It looks like blood or coffee grounds. Your poop is red, bloody, or black. You can't swallow, drink, or eat. At this time there does not appear to be the presence of an emergent medical condition, however there is always the potential for conditions to change. Please read and follow the below instructions.  Do not take your medicine if  develop an itchy rash, swelling in your mouth or lips, or difficulty breathing; call 911 and seek immediate emergency medical attention if this occurs.  You may review your lab tests and imaging results in their entirety on your MyChart account.  Please discuss all results of fully with your primary care provider and other specialist at your follow-up visit.  Note: Portions of this text may have been transcribed using voice recognition software. Every effort was made to ensure accuracy; however, inadvertent computerized transcription errors may still be present.

## 2023-09-10 NOTE — ED Notes (Signed)
 Discharge paperwork given and verbally understood.

## 2023-09-10 NOTE — ED Triage Notes (Signed)
 Pt c/o acute onset shob while sleeping last night, worsens with exertion. Referred by UC. Pt also c/o acid reflux, hx of same. Pt also add that his throat feels "tight"

## 2023-09-10 NOTE — ED Provider Notes (Signed)
 [facility] AT [facility] Provider Note   CSN: [id number] Arrival date & time: 09/10/23  1107     History  Chief Complaint  Patient presents with   Shortness of Breath    James Scott is a 48 y.o. male.  With no significant past medical history presenting to the ED for evaluation of shortness of breath.  He developed some GERD last night which states is typical for him, however lasted longer than he typically does.  Late into the evening last night he developed sudden onset shortness of breath which is worse with exertion.  He states he feels like he is carrying a 100 pound person on his back when he walks.  He denies any chest pain.  No cough.  No abdominal pain.  He is a daily alcohol drinker.  He does smoke cigarettes as well.  He denies recent cancer treatments, surgeries, long distance travel, unilateral leg swelling or pain, history of DVT or PE, exogenous hormone use.  He reports a tight sensation to his throat.   Shortness of Breath      Home Medications Prior to Admission medications   Medication Sig Start Date End Date Taking? Authorizing Provider  pantoprazole (PROTONIX) 20 MG tablet Take 1 tablet (20 mg total) by mouth daily. 09/10/23  Yes Alveena Taira, Edsel Petrin, PA-C  colchicine 0.6 MG tablet 2 tabs po x 1, then one tab po 1 hour later Patient not taking: Reported on 09/10/2023 12/18/18   Domenick Gong, MD  albuterol (PROVENTIL HFA;VENTOLIN HFA) 108 (90 BASE) MCG/ACT inhaler Inhale 2 puffs into the lungs every 4 (four) hours as needed for wheezing. 08/24/14 12/18/18  Graylon Good, PA-C  loratadine (CLARITIN) 10 MG tablet Take 1 tablet (10 mg total) by mouth daily. 11/10/11 12/04/11  Cherrie Distance, PA-C      Allergies    Patient has no known allergies.    Review of Systems   Review of Systems  Respiratory:  Positive for shortness of breath.   All other systems reviewed and are negative.   Physical Exam Updated Vital Signs BP 125/86    Pulse 68   Temp 98.2 F (36.8 C) (Oral)   Resp 11   Ht 5\' 9"  (1.753 m)   Wt 73 kg   SpO2 100%   BMI 23.78 kg/m  Physical Exam Vitals and nursing note reviewed.  Constitutional:      General: He is not in acute distress.    Appearance: Normal appearance. He is normal weight. He is not ill-appearing.     Comments: Resting comfortably in bed  HENT:     Head: Normocephalic and atraumatic.  Cardiovascular:     Rate and Rhythm: Normal rate and regular rhythm.  Pulmonary:     Effort: Pulmonary effort is normal. No respiratory distress.     Breath sounds: Normal breath sounds. No decreased breath sounds, wheezing, rhonchi or rales.     Comments: Speaking in full sentences Abdominal:     General: Abdomen is flat.  Musculoskeletal:        General: Normal range of motion.     Cervical back: Neck supple.     Left lower leg: No edema.  Skin:    General: Skin is warm and dry.  Neurological:     Mental Status: He is alert and oriented to person, place, and time.  Psychiatric:        Mood and Affect: Mood normal.        Behavior:  Behavior normal.     ED Results / Procedures / Treatments   Labs (all labs ordered are listed, but only abnormal results are displayed) Labs Reviewed  BASIC METABOLIC PANEL - Abnormal; Notable for the following components:      Result Value   Glucose, Bld 105 (*)    All other components within normal limits  CBC WITH DIFFERENTIAL/PLATELET - Abnormal; Notable for the following components:   MCH 34.9 (*)    MCHC 36.1 (*)    All other components within normal limits  D-DIMER, QUANTITATIVE    EKG EKG Interpretation Date/Time:  Tuesday September 10 2023 11:18:12 EDT Ventricular Rate:  87 PR Interval:  151 QRS Duration:  82 QT Interval:  354 QTC Calculation: 426 R Axis:   62  Text Interpretation: Sinus rhythm no prior Confirmed by Tanda Rockers ([id number]) on 09/10/2023 11:20:17 AM  Radiology DG Chest 2 View Result Date: 09/10/2023 CLINICAL DATA:   Shortness of breath. EXAM: CHEST - 2 VIEW COMPARISON:  08/24/2014. FINDINGS: Bilateral lung fields are clear. Bilateral costophrenic angles are clear. Normal cardio-mediastinal silhouette. No acute osseous abnormalities. The soft tissues are within normal limits. IMPRESSION: No active cardiopulmonary disease. Electronically Signed   By: Jules Schick M.D.   On: 09/10/2023 14:23    Procedures Procedures    Medications Ordered in ED Medications  famotidine (PEPCID) IVPB 20 mg premix (has no administration in time range)  pantoprazole (PROTONIX) injection 40 mg (has no administration in time range)  alum & mag hydroxide-simeth (MAALOX/MYLANTA) 200-200-20 MG/5ML suspension 30 mL (30 mLs Oral Given 09/10/23 1202)    ED Course/ Medical Decision Making/ A&P                                 Medical Decision Making Amount and/or Complexity of Data Reviewed Labs: ordered. Radiology: ordered.  Risk OTC drugs. Prescription drug management.  This patient presents to the ED for concern of shortness of breath, this involves an extensive number of treatment options, and is a complaint that carries with it a high risk of complications and morbidity.  The emergent differential diagnosis for shortness of breath includes, but is not limited to, Pulmonary edema, bronchoconstriction, Pneumonia, Pulmonary embolism, Pneumotherax/ Hemothorax, Dysrythmia, ACS.    My initial workup includes labs, imaging, EKG  Additional history obtained from: Nursing notes from this visit.  I ordered, reviewed and interpreted labs which include: BMP, CBC, D-dimer no leukocytosis or anemia.  No electrolyte derangement or kidney dysfunction.  Negative D-dimer.  I ordered imaging studies including chest x-ray I independently visualized and interpreted imaging which showed negative I agree with the radiologist interpretation  Cardiac Monitoring:  The patient was maintained on a cardiac monitor.  I personally viewed and  interpreted the cardiac monitored which showed an underlying rhythm of: NSR  Afebrile and hemodynamically stable.  48 year old male presenting to the ED for evaluation of shortness of breath.  States he had significant GERD last night and developed some pain in his esophagus.  Shortly afterwards he started to feel short of breath.  No chest pain.  No cough.  No other respiratory complaints.  He appears very well on physical exam.  He is a daily alcohol drinker.  No vomiting.  Lab workup reassuring.  Chest x-ray reassuring.  No adventitious breath sounds.  Negative D-dimer.  Low suspicion for cardiopulmonary abnormalities.  Suspect potential esophagitis.  He was given Pepcid and Protonix in the  emergency department.  Sent a prescription for Protonix.  He was encouraged to follow-up with a primary care provider.  He was given return precautions.  Stable at discharge.  At this time there does not appear to be any evidence of an acute emergency medical condition and the patient appears stable for discharge with appropriate outpatient follow up. Diagnosis was discussed with patient who verbalizes understanding of care plan and is agreeable to discharge. I have discussed return precautions with patient who verbalizes understanding. Patient encouraged to follow-up with their PCP within 1 week. All questions answered.  Patient's case discussed with Dr. Wallace Cullens who agrees with plan to discharge with follow-up.   Note: Portions of this report may have been transcribed using voice recognition software. Every effort was made to ensure accuracy; however, inadvertent computerized transcription errors may still be present.        Final Clinical Impression(s) / ED Diagnoses Final diagnoses:  Shortness of breath    Rx / DC Orders ED Discharge Orders          Ordered    pantoprazole (PROTONIX) 20 MG tablet  Daily        09/10/23 1436              Michelle Piper, PA-C 09/10/23 1441    Sloan Leiter, DO 09/11/23 [phone]
